# Patient Record
Sex: Female | Born: 1985 | Race: White | Hispanic: No | Smoking: Current every day smoker
Health system: Southern US, Community
[De-identification: ages and names within clinical notes are randomized; demographics above are authoritative.]

## PROBLEM LIST (undated history)

## (undated) DIAGNOSIS — F1910 Other psychoactive substance abuse, uncomplicated: Secondary | ICD-10-CM

## (undated) DIAGNOSIS — M419 Scoliosis, unspecified: Secondary | ICD-10-CM

## (undated) DIAGNOSIS — N809 Endometriosis, unspecified: Secondary | ICD-10-CM

## (undated) DIAGNOSIS — F419 Anxiety disorder, unspecified: Secondary | ICD-10-CM

## (undated) DIAGNOSIS — IMO0002 Reserved for concepts with insufficient information to code with codable children: Secondary | ICD-10-CM

## (undated) DIAGNOSIS — M25569 Pain in unspecified knee: Secondary | ICD-10-CM

---

## 2006-04-19 ENCOUNTER — Emergency Department (HOSPITAL_COMMUNITY): Admission: EM | Admit: 2006-04-19 | Discharge: 2006-04-19 | Payer: Self-pay | Admitting: *Deleted

## 2006-05-08 ENCOUNTER — Emergency Department (HOSPITAL_COMMUNITY): Admission: EM | Admit: 2006-05-08 | Discharge: 2006-05-08 | Payer: Self-pay | Admitting: Emergency Medicine

## 2008-08-22 ENCOUNTER — Ambulatory Visit (HOSPITAL_COMMUNITY): Admission: RE | Admit: 2008-08-22 | Discharge: 2008-08-22 | Payer: Self-pay | Admitting: Urology

## 2008-08-29 ENCOUNTER — Ambulatory Visit (HOSPITAL_COMMUNITY): Admission: RE | Admit: 2008-08-29 | Discharge: 2008-08-29 | Payer: Self-pay | Admitting: Urology

## 2008-11-23 ENCOUNTER — Emergency Department (HOSPITAL_COMMUNITY): Admission: EM | Admit: 2008-11-23 | Discharge: 2008-11-23 | Payer: Self-pay | Admitting: Emergency Medicine

## 2009-04-09 ENCOUNTER — Emergency Department (HOSPITAL_COMMUNITY): Admission: EM | Admit: 2009-04-09 | Discharge: 2009-04-09 | Payer: Self-pay | Admitting: Emergency Medicine

## 2010-10-10 LAB — URINALYSIS, ROUTINE W REFLEX MICROSCOPIC
Nitrite: NEGATIVE
Specific Gravity, Urine: 1.025 (ref 1.005–1.030)
Urobilinogen, UA: 0.2 mg/dL (ref 0.0–1.0)

## 2010-10-10 LAB — BASIC METABOLIC PANEL
BUN: 7 mg/dL (ref 6–23)
Chloride: 106 mEq/L (ref 96–112)
Glucose, Bld: 92 mg/dL (ref 70–99)
Potassium: 3.9 mEq/L (ref 3.5–5.1)

## 2010-10-10 LAB — URINE MICROSCOPIC-ADD ON

## 2010-10-10 LAB — DIFFERENTIAL
Eosinophils Absolute: 0.1 10*3/uL (ref 0.0–0.7)
Eosinophils Relative: 1 % (ref 0–5)
Lymphs Abs: 1.3 10*3/uL (ref 0.7–4.0)

## 2010-10-10 LAB — CBC
HCT: 35.1 % — ABNORMAL LOW (ref 36.0–46.0)
MCV: 95.3 fL (ref 78.0–100.0)
Platelets: 236 10*3/uL (ref 150–400)
RDW: 13 % (ref 11.5–15.5)
WBC: 7.7 10*3/uL (ref 4.0–10.5)

## 2010-10-10 LAB — HEPATIC FUNCTION PANEL
Alkaline Phosphatase: 43 U/L (ref 39–117)
Bilirubin, Direct: 0.2 mg/dL (ref 0.0–0.3)
Indirect Bilirubin: 1.1 mg/dL — ABNORMAL HIGH (ref 0.3–0.9)
Total Protein: 6.7 g/dL (ref 6.0–8.3)

## 2010-10-10 LAB — RAPID URINE DRUG SCREEN, HOSP PERFORMED: Cocaine: NOT DETECTED

## 2010-10-10 LAB — PREGNANCY, URINE: Preg Test, Ur: NEGATIVE

## 2010-10-10 LAB — TRICYCLICS SCREEN, URINE: TCA Scrn: NOT DETECTED

## 2010-11-19 NOTE — H&P (Signed)
Kayla Tanner, Kayla Tanner                  ACCOUNT NO.:  [id number]   MEDICAL RECORD NO.:  [id number]          PATIENT TYPE:  AMB   LOCATION:  DAY                           FACILITY:  [facility]   PHYSICIAN:  Dennie Maizes, M.D.   DATE OF BIRTH:  Aug 10, 1985   DATE OF ADMISSION:  DATE OF DISCHARGE:  LH                              HISTORY & PHYSICAL   CHIEF COMPLAINT:  Intermittent right flank pain, right distal ureteral  calculus with obstruction.   HISTORY OF PRESENT ILLNESS:  A 25 year old female was referred to me by  the ER at [facility].  She experienced severe right flank pain  radiating to the front associated with nausea and vomiting.  She went to  the emergency room on August 13 2008.  There is no past history of  urolithiasis or gross hematuria.  She has not had any fever, chills or  voiding difficulty.   A noncontrast CT of abdomen and pelvis was done in the ER.  This  includes two small stones in the upper pole of the left kidney.  There  is a 4-mm size stone in the right ureteropelvic junction with mild  hydronephrosis.  The patient has not passed the stone.  She has  persistent intermittent right flank pain.  X-ray KUB revealed the stone  has migrated to the right ureterovesical  junction area.   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   MEDICATIONS:  Oxycodone.   ALLERGIES:  PENICILLIN and LATEX.   FAMILY HISTORY:  Positive for kidney stones as well as cervical cast  carcinoma.   PERSONAL HISTORY:  The patient has two children,  ages 6 years and 7  weeks.   PHYSICAL EXAMINATION:  VITAL SIGNS: Height 5 feet 5 inches.  Weight 135  pounds.  HEENT:  Normal.  LUNGS:  Clear to auscultation.  HEART:  Regular rate and rhythm.  No murmurs.  ABDOMEN:  Soft.  No palpable flank masses.  Mild right costovertebral  angle tenderness was noted.  Bladder is not palpable.   IMPRESSION:  1. Right distal ureteral calculus with  obstruction.  2. Right hydronephrosis.  3. Right renal colic.   PLAN:  I have discussed with the patient regarding management of pain  options.  She is scheduled to undergo cystoscopy, retrograde pyelogram,  right ureteroscopy, stone extraction, and right ureteral stent placement  in short-stay center at [facility].  I have informed the  patient regarding diagnosis, operative details, alternative treatments,  outcome, possible risks and complications and she has agreed for the  procedure to be done.      Dennie Maizes, M.D.  Electronically Signed     SK/MEDQ  D:  09/03/2008  T:  09/03/2008  Job:  [id number]   cc:   Selinda Flavin  Fax: [phone]

## 2011-10-04 ENCOUNTER — Emergency Department (HOSPITAL_COMMUNITY)
Admission: EM | Admit: 2011-10-04 | Discharge: 2011-10-04 | Disposition: A | Discharge status: Home / Self Care | Payer: Self-pay | Attending: Emergency Medicine | Admitting: Emergency Medicine

## 2011-10-04 ENCOUNTER — Encounter (HOSPITAL_COMMUNITY): Payer: Self-pay | Admitting: *Deleted

## 2011-10-04 ENCOUNTER — Emergency Department (HOSPITAL_COMMUNITY): Payer: Self-pay

## 2011-10-04 DIAGNOSIS — X500XXA Overexertion from strenuous movement or load, initial encounter: Secondary | ICD-10-CM | POA: Insufficient documentation

## 2011-10-04 DIAGNOSIS — M545 Low back pain, unspecified: Secondary | ICD-10-CM | POA: Insufficient documentation

## 2011-10-04 DIAGNOSIS — M5380 Other specified dorsopathies, site unspecified: Secondary | ICD-10-CM | POA: Insufficient documentation

## 2011-10-04 DIAGNOSIS — R109 Unspecified abdominal pain: Secondary | ICD-10-CM | POA: Insufficient documentation

## 2011-10-04 DIAGNOSIS — S39012A Strain of muscle, fascia and tendon of lower back, initial encounter: Secondary | ICD-10-CM

## 2011-10-04 DIAGNOSIS — F17200 Nicotine dependence, unspecified, uncomplicated: Secondary | ICD-10-CM | POA: Insufficient documentation

## 2011-10-04 DIAGNOSIS — S335XXA Sprain of ligaments of lumbar spine, initial encounter: Secondary | ICD-10-CM | POA: Insufficient documentation

## 2011-10-04 HISTORY — DX: Scoliosis, unspecified: M41.9

## 2011-10-04 LAB — POCT PREGNANCY, URINE: Preg Test, Ur: NEGATIVE

## 2011-10-04 MED ORDER — IBUPROFEN 800 MG PO TABS: 800.0000 mg | ORAL_TABLET | Freq: Three times a day (TID) | ORAL | Status: AC

## 2011-10-04 MED ORDER — ONDANSETRON 8 MG PO TBDP
8.0000 mg | ORAL_TABLET | Freq: Once | ORAL | Status: AC
  Administered 2011-10-04: 8 mg via ORAL
  Filled 2011-10-04: qty 1

## 2011-10-04 MED ORDER — OXYCODONE-ACETAMINOPHEN 5-325 MG PO TABS: 1.0000 | ORAL_TABLET | ORAL | Status: AC | PRN

## 2011-10-04 MED ORDER — HYDROMORPHONE HCL PF 1 MG/ML IJ SOLN
1.0000 mg | Freq: Once | INTRAMUSCULAR | Status: AC
  Administered 2011-10-04: 1 mg via INTRAMUSCULAR
  Filled 2011-10-04: qty 1

## 2011-10-04 MED ORDER — METHOCARBAMOL 500 MG PO TABS: 1000.0000 mg | ORAL_TABLET | Freq: Two times a day (BID) | ORAL | Status: AC

## 2011-10-04 NOTE — ED Notes (Signed)
Pt in process of moving and picked up a TV and since c/o lower back pain that radiates some

## 2011-10-04 NOTE — Discharge Instructions (Signed)
Back Pain, Adult Low back pain is very common. About 1 in 5 people have back pain.The cause of low back pain is rarely dangerous. The pain often gets better over time.About half of people with a sudden onset of back pain feel better in just 2 weeks. About 8 in 10 people feel better by 6 weeks.  CAUSES Some common causes of back pain include:  Strain of the muscles or ligaments supporting the spine.   Wear and tear (degeneration) of the spinal discs.   Arthritis.   Direct injury to the back.  DIAGNOSIS Most of the time, the direct cause of low back pain is not known.However, back pain can be treated effectively even when the exact cause of the pain is unknown.Answering your caregiver's questions about your overall health and symptoms is one of the most accurate ways to make sure the cause of your pain is not dangerous. If your caregiver needs more information, he or she may order lab work or imaging tests (X-rays or MRIs).However, even if imaging tests show changes in your back, this usually does not require surgery. HOME CARE INSTRUCTIONS For many people, back pain returns.Since low back pain is rarely dangerous, it is often a condition that people can learn to manageon their own.   Remain active. It is stressful on the back to sit or stand in one place. Do not sit, drive, or stand in one place for more than 30 minutes at a time. Take short walks on level surfaces as soon as pain allows.Try to increase the length of time you walk each day.   Do not stay in bed.Resting more than 1 or 2 days can delay your recovery.   Do not avoid exercise or work.Your body is made to move.It is not dangerous to be active, even though your back may hurt.Your back will likely heal faster if you return to being active before your pain is gone.   Pay attention to your body when you bend and lift. Many people have less discomfortwhen lifting if they bend their knees, keep the load close to their  bodies,and avoid twisting. Often, the most comfortable positions are those that put less stress on your recovering back.   Find a comfortable position to sleep. Use a firm mattress and lie on your side with your knees slightly bent. If you lie on your back, put a pillow under your knees.   Only take over-the-counter or prescription medicines as directed by your caregiver. Over-the-counter medicines to reduce pain and inflammation are often the most helpful.Your caregiver may prescribe muscle relaxant drugs.These medicines help dull your pain so you can more quickly return to your normal activities and healthy exercise.   Put ice on the injured area.   Put ice in a plastic bag.   Place a towel between your skin and the bag.   Leave the ice on for 15 to 20 minutes, 3 to 4 times a day for the first 2 to 3 days. After that, ice and heat may be alternated to reduce pain and spasms.   Ask your caregiver about trying back exercises and gentle massage. This may be of some benefit.   Avoid feeling anxious or stressed.Stress increases muscle tension and can worsen back pain.It is important to recognize when you are anxious or stressed and learn ways to manage it.Exercise is a great option.  SEEK MEDICAL CARE IF:  You have pain that is not relieved with rest or medicine.   You have   pain that does not improve in 1 week.   You have new symptoms.   You are generally not feeling well.  SEEK IMMEDIATE MEDICAL CARE IF:   You have pain that radiates from your back into your legs.   You develop new bowel or bladder control problems.   You have unusual weakness or numbness in your arms or legs.   You develop nausea or vomiting.   You develop abdominal pain.   You feel faint.  Document Released: 06/23/2005 Document Revised: 06/12/2011 Document Reviewed: 11/11/2010 [facility] Patient Information 2012 [place], [place].     Do not drive within 4 hours of taking oxycodone as this will make you  drowsy.  Avoid lifting,  Bending,  Twisting or any other activity that worsens your pain over the next week.   You should get rechecked if your symptoms are not better over the next 5 days,  Or you develop increased pain,  Weakness in your leg(s) or loss of bladder or bowel function - these are symptoms of a worse injury.

## 2011-10-04 NOTE — ED Provider Notes (Signed)
History     CSN: [id number]  Arrival date & time 10/04/11  1449   First MD Initiated Contact with Patient 10/04/11 1520      Chief Complaint  Patient presents with  . Back Pain    (Consider location/radiation/quality/duration/timing/severity/associated sxs/prior treatment) Patient is a 26 y.o. female presenting with back pain. The history is provided by the patient.  Back Pain  This is a new problem. The current episode started 3 to 5 hours ago. The problem occurs constantly. The problem has not changed since onset.The pain is associated with lifting heavy objects (She lifted a tv while moving today.). The pain is present in the lumbar spine. The quality of the pain is described as stabbing. Radiates to: Radiates to right lower flank. The pain is at a severity of 8/10. The pain is moderate. The symptoms are aggravated by bending, twisting and certain positions. The pain is the same all the time. Pertinent negatives include no chest pain, no fever, no numbness, no headaches, no abdominal pain, no abdominal swelling, no bowel incontinence, no perianal numbness, no bladder incontinence, no dysuria, no pelvic pain, no leg pain, no paresthesias, no paresis, no tingling and no weakness. She has tried nothing for the symptoms. Risk factors: scoliosis.    Past Medical History  Diagnosis Date  . Scoliosis     Past Surgical History  Procedure Date  . Cesarean section     History reviewed. No pertinent family history.  History  Substance Use Topics  . Smoking status: Current Everyday Smoker -- 0.5 packs/day    Types: Cigarettes  . Smokeless tobacco: Not on file  . Alcohol Use: Yes     very rare use    OB History    Grav Para Term Preterm Abortions TAB SAB Ect Mult Living                  Review of Systems  Constitutional: Negative for fever.  HENT: Negative for congestion, sore throat and neck pain.   Eyes: Negative.   Respiratory: Negative for chest tightness and shortness of  breath.   Cardiovascular: Negative for chest pain.  Gastrointestinal: Negative for nausea, abdominal pain and bowel incontinence.  Genitourinary: Negative.  Negative for bladder incontinence, dysuria and pelvic pain.  Musculoskeletal: Positive for back pain. Negative for joint swelling and arthralgias.  Skin: Negative.  Negative for rash and wound.  Neurological: Negative for dizziness, tingling, weakness, light-headedness, numbness, headaches and paresthesias.  Hematological: Negative.   Psychiatric/Behavioral: Negative.     Allergies  Penicillins  Home Medications  No current outpatient prescriptions on file.  BP 116/81  Pulse 107  Temp(Src) 97.8 F (36.6 C) (Oral)  Resp 18  Ht 5\' 5"  (1.651 m)  Wt 123 lb (55.792 kg)  BMI 20.47 kg/m2  SpO2 100%  LMP 08/21/2011  Physical Exam  Nursing note and vitals reviewed. Constitutional: She is oriented to person, place, and time. She appears well-developed and well-nourished.  HENT:  Head: Normocephalic.  Eyes: Conjunctivae are normal.  Neck: Normal range of motion. Neck supple.  Cardiovascular: Regular rhythm and intact distal pulses.        Pedal pulses normal.  Pulmonary/Chest: Effort normal. She has no wheezes.  Abdominal: Soft. Bowel sounds are normal. She exhibits no distension and no mass.  Musculoskeletal: Normal range of motion. She exhibits no edema.       Lumbar back: She exhibits tenderness and spasm. She exhibits no swelling and no edema.  Back:  Neurological: She is alert and oriented to person, place, and time. She has normal strength. She displays no atrophy and no tremor. No cranial nerve deficit or sensory deficit. Gait normal.  Reflex Scores:      Patellar reflexes are 2+ on the right side and 2+ on the left side.      Achilles reflexes are 2+ on the right side and 2+ on the left side.      No strength deficit noted in hip and knee flexor and extensor muscle groups.  Ankle flexion and extension intact.    Skin: Skin is warm and dry.  Psychiatric: She has a normal mood and affect.    ED Course  Procedures (including critical care time)   Labs Reviewed  POCT PREGNANCY, URINE   Dg Lumbar Spine Complete  10/04/2011  *RADIOLOGY REPORT*  Clinical Data: History of low back pain after taking out a heavy object.  LUMBAR SPINE - COMPLETE 4+ VIEW  Comparison: No priors.  Findings: Multiple views of the lumbar spine demonstrate no acute displaced fractures or compression type fractures.  Alignment is anatomic.  Intervertebral disc heights appear preserved at all levels.  IMPRESSION: 1.  No acute radiographic abnormality of the lumbar spine.  Original Report Authenticated By: Florencia Reasons, M.D.     No diagnosis found.  Dilaudid 1 mg IM given with moderate improvement in symptoms.  MDM  Patient prescribed Percocet, ibuprofen 800 mg, Robaxin.  Five-day course in edition patient advised to minimize lifting bending twisting or any activity that worsens her pain.  Heat therapy 20 minutes 4 times a day.  Recheck after 5 days if her symptoms are not improved or if they return after she completes this course.  Also advised to watch for any signs of weakness in her legs, numbness, urinary or bowel changes.        Candis Musa, PA 10/04/11 1642

## 2011-10-04 NOTE — ED Provider Notes (Signed)
Medical screening examination/treatment/procedure(s) were performed by non-physician practitioner and as supervising physician I was immediately available for consultation/collaboration.  Latese Dufault, MD 10/04/11 1908 

## 2011-11-29 ENCOUNTER — Emergency Department (HOSPITAL_COMMUNITY)
Admission: EM | Admit: 2011-11-29 | Discharge: 2011-11-29 | Disposition: A | Discharge status: Home / Self Care | Payer: Self-pay | Attending: Emergency Medicine | Admitting: Emergency Medicine

## 2011-11-29 ENCOUNTER — Encounter (HOSPITAL_COMMUNITY): Payer: Self-pay

## 2011-11-29 ENCOUNTER — Emergency Department (HOSPITAL_COMMUNITY): Payer: Self-pay

## 2011-11-29 DIAGNOSIS — IMO0002 Reserved for concepts with insufficient information to code with codable children: Secondary | ICD-10-CM | POA: Insufficient documentation

## 2011-11-29 DIAGNOSIS — M549 Dorsalgia, unspecified: Secondary | ICD-10-CM | POA: Insufficient documentation

## 2011-11-29 DIAGNOSIS — S239XXA Sprain of unspecified parts of thorax, initial encounter: Secondary | ICD-10-CM | POA: Insufficient documentation

## 2011-11-29 MED ORDER — IBUPROFEN 600 MG PO TABS: 600.0000 mg | ORAL_TABLET | Freq: Four times a day (QID) | ORAL | Status: AC | PRN

## 2011-11-29 MED ORDER — HYDROMORPHONE HCL PF 1 MG/ML IJ SOLN
1.0000 mg | Freq: Once | INTRAMUSCULAR | Status: AC
  Administered 2011-11-29: 1 mg via INTRAMUSCULAR
  Filled 2011-11-29: qty 1

## 2011-11-29 MED ORDER — ONDANSETRON 8 MG PO TBDP
8.0000 mg | ORAL_TABLET | Freq: Once | ORAL | Status: AC
  Administered 2011-11-29: 8 mg via ORAL
  Filled 2011-11-29: qty 1

## 2011-11-29 MED ORDER — HYDROCODONE-ACETAMINOPHEN 5-325 MG PO TABS: 1.0000 | ORAL_TABLET | ORAL | Status: AC | PRN
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
## 2011-11-29 NOTE — Discharge Instructions (Signed)
Back Pain, Adult Low back pain is very common. About 1 in 5 people have back pain.The cause of low back pain is rarely dangerous. The pain often gets better over time.About half of people with a sudden onset of back pain feel better in just 2 weeks. About 8 in 10 people feel better by 6 weeks.  CAUSES Some common causes of back pain include:  Strain of the muscles or ligaments supporting the spine.   Wear and tear (degeneration) of the spinal discs.   Arthritis.   Direct injury to the back.  DIAGNOSIS Most of the time, the direct cause of low back pain is not known.However, back pain can be treated effectively even when the exact cause of the pain is unknown.Answering your caregiver's questions about your overall health and symptoms is one of the most accurate ways to make sure the cause of your pain is not dangerous. If your caregiver needs more information, he or she may order lab work or imaging tests (X-rays or MRIs).However, even if imaging tests show changes in your back, this usually does not require surgery. HOME CARE INSTRUCTIONS For many people, back pain returns.Since low back pain is rarely dangerous, it is often a condition that people can learn to manageon their own.   Remain active. It is stressful on the back to sit or stand in one place. Do not sit, drive, or stand in one place for more than 30 minutes at a time. Take short walks on level surfaces as soon as pain allows.Try to increase the length of time you walk each day.   Do not stay in bed.Resting more than 1 or 2 days can delay your recovery.   Do not avoid exercise or work.Your body is made to move.It is not dangerous to be active, even though your back may hurt.Your back will likely heal faster if you return to being active before your pain is gone.   Pay attention to your body when you bend and lift. Many people have less discomfortwhen lifting if they bend their knees, keep the load close to their  bodies,and avoid twisting. Often, the most comfortable positions are those that put less stress on your recovering back.   Find a comfortable position to sleep. Use a firm mattress and lie on your side with your knees slightly bent. If you lie on your back, put a pillow under your knees.   Only take over-the-counter or prescription medicines as directed by your caregiver. Over-the-counter medicines to reduce pain and inflammation are often the most helpful.Your caregiver may prescribe muscle relaxant drugs.These medicines help dull your pain so you can more quickly return to your normal activities and healthy exercise.   Put ice on the injured area.   Put ice in a plastic bag.   Place a towel between your skin and the bag.   Leave the ice on for 15 to 20 minutes, 3 to 4 times a day for the first 2 to 3 days. After that, ice and heat may be alternated to reduce pain and spasms.   Ask your caregiver about trying back exercises and gentle massage. This may be of some benefit.   Avoid feeling anxious or stressed.Stress increases muscle tension and can worsen back pain.It is important to recognize when you are anxious or stressed and learn ways to manage it.Exercise is a great option.  SEEK MEDICAL CARE IF:  You have pain that is not relieved with rest or medicine.   You have   pain that does not improve in 1 week.   You have new symptoms.   You are generally not feeling well.  SEEK IMMEDIATE MEDICAL CARE IF:   You have pain that radiates from your back into your legs.   You develop new bowel or bladder control problems.   You have unusual weakness or numbness in your arms or legs.   You develop nausea or vomiting.   You develop abdominal pain.   You feel faint.  Document Released: 06/23/2005 Document Revised: 06/12/2011 Document Reviewed: 11/11/2010 [facility] Patient Information 2012 [place], [place].   Your x-rays are negative tonight for any bony injury to your back.    Use  the the other medicines as directed.  Do not drive within 4 hours of taking oxycodone as this will make you drowsy.  Avoid lifting,  Bending,  Twisting or any other activity that worsens your pain over the next week.  Apply an  icepack  to your lower back for 10-15 minutes every 2 hours for the next 2 days.  You should get rechecked if your symptoms are not better over the next 5 days,  Or you develop increased pain,  Weakness in your leg(s) or loss of bladder or bowel function - these are symptoms of a worse injury.

## 2011-11-29 NOTE — ED Notes (Signed)
J. Idol, PA at bedside. 
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
## 2011-12-04 NOTE — ED Provider Notes (Signed)
Medical screening examination/treatment/procedure(s) were performed by non-physician practitioner and as supervising physician I was immediately available for consultation/collaboration.   Kaitlan Bin M Lonzell Dorris, MD 12/04/11 0054 

## 2012-02-19 ENCOUNTER — Emergency Department (HOSPITAL_COMMUNITY): Payer: Self-pay

## 2012-02-19 ENCOUNTER — Encounter (HOSPITAL_COMMUNITY): Payer: Self-pay | Admitting: *Deleted

## 2012-02-19 ENCOUNTER — Emergency Department (HOSPITAL_COMMUNITY)
Admission: EM | Admit: 2012-02-19 | Discharge: 2012-02-19 | Disposition: A | Discharge status: Home / Self Care | Payer: Self-pay | Attending: Emergency Medicine | Admitting: Emergency Medicine

## 2012-02-19 DIAGNOSIS — S0083XA Contusion of other part of head, initial encounter: Secondary | ICD-10-CM

## 2012-02-19 DIAGNOSIS — F17200 Nicotine dependence, unspecified, uncomplicated: Secondary | ICD-10-CM | POA: Insufficient documentation

## 2012-02-19 DIAGNOSIS — IMO0002 Reserved for concepts with insufficient information to code with codable children: Secondary | ICD-10-CM | POA: Insufficient documentation

## 2012-02-19 DIAGNOSIS — S63609A Unspecified sprain of unspecified thumb, initial encounter: Secondary | ICD-10-CM

## 2012-02-19 DIAGNOSIS — S0003XA Contusion of scalp, initial encounter: Secondary | ICD-10-CM | POA: Insufficient documentation

## 2012-02-19 DIAGNOSIS — T07XXXA Unspecified multiple injuries, initial encounter: Secondary | ICD-10-CM

## 2012-02-19 DIAGNOSIS — S43402A Unspecified sprain of left shoulder joint, initial encounter: Secondary | ICD-10-CM

## 2012-02-19 DIAGNOSIS — S6390XA Sprain of unspecified part of unspecified wrist and hand, initial encounter: Secondary | ICD-10-CM | POA: Insufficient documentation

## 2012-02-19 LAB — URINALYSIS, ROUTINE W REFLEX MICROSCOPIC
Bilirubin Urine: NEGATIVE
Hgb urine dipstick: NEGATIVE
Nitrite: NEGATIVE
Specific Gravity, Urine: 1.025 (ref 1.005–1.030)
pH: 7 (ref 5.0–8.0)

## 2012-02-19 LAB — POCT PREGNANCY, URINE: Preg Test, Ur: NEGATIVE

## 2012-02-19 MED ORDER — HYDROMORPHONE HCL PF 1 MG/ML IJ SOLN
1.0000 mg | Freq: Once | INTRAMUSCULAR | Status: AC
  Administered 2012-02-19: 1 mg via INTRAMUSCULAR
  Filled 2012-02-19: qty 1

## 2012-02-19 MED ORDER — HYDROCODONE-ACETAMINOPHEN 7.5-325 MG PO TABS: 1.0000 | ORAL_TABLET | Freq: Four times a day (QID) | ORAL | Status: AC | PRN

## 2012-02-19 MED ORDER — MELOXICAM 7.5 MG PO TABS: ORAL_TABLET | ORAL | Status: DC

## 2012-02-19 MED ORDER — PROMETHAZINE HCL 12.5 MG PO TABS
12.5000 mg | ORAL_TABLET | Freq: Once | ORAL | Status: AC
  Administered 2012-02-19: 12.5 mg via ORAL
  Filled 2012-02-19: qty 1
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
## 2012-02-19 NOTE — ED Provider Notes (Signed)
Medical screening examination/treatment/procedure(s) were performed by non-physician practitioner and as supervising physician I was immediately available for consultation/collaboration.   Tinzley Dalia L Natasha Paulson, MD 02/19/12 1422 

## 2013-09-22 ENCOUNTER — Ambulatory Visit (HOSPITAL_COMMUNITY): Admission: RE | Admit: 2013-09-22 | Payer: Medicaid Other | Source: Home / Self Care | Admitting: Psychiatry

## 2013-09-22 ENCOUNTER — Emergency Department (HOSPITAL_COMMUNITY)
Admission: EM | Admit: 2013-09-22 | Discharge: 2013-09-22 | Disposition: A | Discharge status: Home / Self Care | Payer: Medicaid Other | Attending: Emergency Medicine | Admitting: Emergency Medicine

## 2013-09-22 ENCOUNTER — Encounter (HOSPITAL_COMMUNITY): Payer: Self-pay | Admitting: Emergency Medicine

## 2013-09-22 DIAGNOSIS — F1110 Opioid abuse, uncomplicated: Secondary | ICD-10-CM

## 2013-09-22 DIAGNOSIS — F1120 Opioid dependence, uncomplicated: Secondary | ICD-10-CM | POA: Insufficient documentation

## 2013-09-22 DIAGNOSIS — R002 Palpitations: Secondary | ICD-10-CM | POA: Insufficient documentation

## 2013-09-22 DIAGNOSIS — M4120 Other idiopathic scoliosis, site unspecified: Secondary | ICD-10-CM | POA: Insufficient documentation

## 2013-09-22 DIAGNOSIS — F39 Unspecified mood [affective] disorder: Secondary | ICD-10-CM | POA: Insufficient documentation

## 2013-09-22 DIAGNOSIS — Z791 Long term (current) use of non-steroidal anti-inflammatories (NSAID): Secondary | ICD-10-CM | POA: Insufficient documentation

## 2013-09-22 DIAGNOSIS — F411 Generalized anxiety disorder: Secondary | ICD-10-CM | POA: Insufficient documentation

## 2013-09-22 DIAGNOSIS — Z880 Allergy status to penicillin: Secondary | ICD-10-CM | POA: Insufficient documentation

## 2013-09-22 DIAGNOSIS — F17200 Nicotine dependence, unspecified, uncomplicated: Secondary | ICD-10-CM | POA: Insufficient documentation

## 2013-09-22 DIAGNOSIS — H53149 Visual discomfort, unspecified: Secondary | ICD-10-CM | POA: Insufficient documentation

## 2013-09-22 HISTORY — DX: Anxiety disorder, unspecified: F41.9

## 2013-09-22 HISTORY — DX: Endometriosis, unspecified: N80.9

## 2013-09-22 HISTORY — DX: Other psychoactive substance abuse, uncomplicated: F19.10

## 2013-09-22 HISTORY — DX: Pain in unspecified knee: M25.569

## 2013-09-22 HISTORY — DX: Reserved for concepts with insufficient information to code with codable children: IMO0002

## 2013-09-22 NOTE — ED Provider Notes (Signed)
CSN: [id number]     Arrival date & time 09/22/13  1225 History  This chart was scribed for non-physician practitioner working with Celene Kras, MD by Ashley Jacobs, ED scribe. This patient was seen in room WTR7/WTR7 and the patient's care was started at 1:51 PM.   First MD Initiated Contact with Patient 09/22/13 1333     Chief Complaint  Patient presents with  . Medical Clearance    opiate addiction     (Consider location/radiation/quality/duration/timing/severity/associated sxs/prior Treatment) The history is provided by the patient and medical records. No language interpreter was used.   HPI Comments: Kayla Tanner is a 28 y.o. female who presents to the Emergency Department for Medical Clearance. Pt is requesting detox from opiates. She has tried detoxing from opiates four times in the past w/o the assistance of professional help.She states, "I have been taking anything I can get my hands on.". She feels like she is having a panic attack. Pt is experiencing photophobia and tachycardia .  Pt was seen at [facility] today and was told to go the ED. Denies HI and SI.  Denies the use of alcohol. Pt currently smokes 0.5 pk of cigarettes a day and smoke marijuana last week.  Past Medical History  Diagnosis Date  . Scoliosis    Past Surgical History  Procedure Laterality Date  . Cesarean section     No family history on file. History  Substance Use Topics  . Smoking status: Current Every Day Smoker -- 0.50 packs/day    Types: Cigarettes  . Smokeless tobacco: Not on file  . Alcohol Use: Yes     Comment: very rare use   OB History   Grav Para Term Preterm Abortions TAB SAB Ect Mult Living                 Review of Systems  Eyes: Positive for photophobia.  Cardiovascular: Positive for palpitations.  Psychiatric/Behavioral: Positive for dysphoric mood. Negative for suicidal ideas. The patient is nervous/anxious.   All other systems reviewed and are  negative.      Allergies  Penicillins  Home Medications   Current Outpatient Rx  Name  Route  Sig  Dispense  Refill  . meloxicam (MOBIC) 7.5 MG tablet      1 po bid with food   12 tablet   0    BP 112/54  Pulse 80  Temp(Src) 98 F (36.7 C) (Oral)  Resp 18  Wt 135 lb (61.236 kg)  SpO2 100% Physical Exam  Nursing note and vitals reviewed. Constitutional: She is oriented to person, place, and time. She appears well-developed and well-nourished. No distress.  Pt appears anxious.  HENT:  Head: Normocephalic and atraumatic.  Eyes: EOM are normal. Pupils are equal, round, and reactive to light.  Neck: Normal range of motion. Neck supple. No tracheal deviation present.  Cardiovascular: Normal rate and regular rhythm.   Pulmonary/Chest: Effort normal. No respiratory distress. She has no wheezes. She has no rales.  Abdominal: Soft. She exhibits no distension.  Musculoskeletal: Normal range of motion.  Neurological: She is alert and oriented to person, place, and time.  Skin: Skin is warm and dry.  Psychiatric: She has a normal mood and affect. Her behavior is normal.    ED Course  Procedures (including critical care time) DIAGNOSTIC STUDIES: Oxygen Saturation is 100% on room air, normal by my interpretation.    COORDINATION OF CARE:  1:55 PM  Discussed course of care with pt .  Pt understands and agrees.    Labs Review Labs Reviewed - No data to display Imaging Review No results found.   EKG Interpretation None      MDM   Final diagnoses:  Opiate abuse, continuous    2:23 PM  Patient will be discharged with outpatient resources for detox. Vitals stable and patient afebrile. ACT team spoke with the patient.   I personally performed the services described in this documentation, which was scribed in my presence. The recorded information has been reviewed and is accurate.     Emilia BeckKaitlyn Lc Joynt, PA-C 09/22/13 1424

## 2013-09-22 NOTE — ED Notes (Signed)
Pt is requesting help with detox from opiates. Denies alcohol use. Pt alert and cooperative. Pt stated that she "wants to get straight for her kids". Previous history of attempted detox x4. Did not seek professional help for previous attempts.  Friend at bedside

## 2013-09-22 NOTE — ED Provider Notes (Signed)
Medical screening examination/treatment/procedure(s) were performed by non-physician practitioner and as supervising physician I was immediately available for consultation/collaboration.   Celene KrasJon R Bereket Gernert, MD 09/22/13 [phone]

## 2013-09-22 NOTE — Discharge Instructions (Signed)
Use the resources provided. Refer to attached documents for more information.
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
## 2021-12-02 ENCOUNTER — Emergency Department (HOSPITAL_COMMUNITY)
Admission: EM | Admit: 2021-12-02 | Discharge: 2021-12-02 | Disposition: A | Discharge status: Home / Self Care | Payer: Medicaid Other | Attending: Emergency Medicine | Admitting: Emergency Medicine

## 2021-12-02 ENCOUNTER — Encounter (HOSPITAL_COMMUNITY): Payer: Self-pay

## 2021-12-02 ENCOUNTER — Emergency Department (HOSPITAL_COMMUNITY): Payer: Medicaid Other

## 2021-12-02 DIAGNOSIS — R600 Localized edema: Secondary | ICD-10-CM | POA: Diagnosis not present

## 2021-12-02 DIAGNOSIS — M7989 Other specified soft tissue disorders: Secondary | ICD-10-CM | POA: Diagnosis present

## 2021-12-02 DIAGNOSIS — L309 Dermatitis, unspecified: Secondary | ICD-10-CM | POA: Insufficient documentation

## 2021-12-02 LAB — COMPREHENSIVE METABOLIC PANEL
ALT: 16 U/L (ref 0–44)
AST: 18 U/L (ref 15–41)
Albumin: 4 g/dL (ref 3.5–5.0)
Alkaline Phosphatase: 56 U/L (ref 38–126)
Anion gap: 5 (ref 5–15)
BUN: 15 mg/dL (ref 6–20)
CO2: 30 mmol/L (ref 22–32)
Calcium: 8.7 mg/dL — ABNORMAL LOW (ref 8.9–10.3)
Chloride: 104 mmol/L (ref 98–111)
Creatinine, Ser: 0.94 mg/dL (ref 0.44–1.00)
GFR, Estimated: >60 mL/min (ref >60)
Glucose, Bld: 98 mg/dL (ref 70–99)
Potassium: 3.9 mmol/L (ref 3.5–5.1)
Sodium: 139 mmol/L (ref 135–145)
Total Bilirubin: <0.1 mg/dL — ABNORMAL LOW (ref 0.3–1.2)
Total Protein: 7.4 g/dL (ref 6.5–8.1)

## 2021-12-02 LAB — URINALYSIS, ROUTINE W REFLEX MICROSCOPIC
Bilirubin Urine: NEGATIVE
Glucose, UA: NEGATIVE mg/dL
Hgb urine dipstick: NEGATIVE
Ketones, ur: NEGATIVE mg/dL
Leukocytes,Ua: NEGATIVE
Nitrite: NEGATIVE
Protein, ur: NEGATIVE mg/dL
Specific Gravity, Urine: 1.019 (ref 1.005–1.030)
pH: 7 (ref 5.0–8.0)

## 2021-12-02 LAB — CBC WITH DIFFERENTIAL/PLATELET
Abs Immature Granulocytes: 0.02 10*3/uL (ref 0.00–0.07)
Basophils Absolute: 0.1 10*3/uL (ref 0.0–0.1)
Basophils Relative: 1 %
Eosinophils Absolute: 0.2 10*3/uL (ref 0.0–0.5)
Eosinophils Relative: 4 %
HCT: 37.8 % (ref 36.0–46.0)
Hemoglobin: 12.4 g/dL (ref 12.0–15.0)
Immature Granulocytes: 0 %
Lymphocytes Relative: 33 %
Lymphs Abs: 2 10*3/uL (ref 0.7–4.0)
MCH: 30.8 pg (ref 26.0–34.0)
MCHC: 32.8 g/dL (ref 30.0–36.0)
MCV: 94 fL (ref 80.0–100.0)
Monocytes Absolute: 0.5 10*3/uL (ref 0.1–1.0)
Monocytes Relative: 9 %
Neutro Abs: 3.3 10*3/uL (ref 1.7–7.7)
Neutrophils Relative %: 53 %
Platelets: 376 10*3/uL (ref 150–400)
RBC: 4.02 MIL/uL (ref 3.87–5.11)
RDW: 12.7 % (ref 11.5–15.5)
WBC: 6.1 10*3/uL (ref 4.0–10.5)
nRBC: 0 % (ref 0.0–0.2)

## 2021-12-02 LAB — BRAIN NATRIURETIC PEPTIDE: B Natriuretic Peptide: 50 pg/mL (ref 0.0–100.0)

## 2021-12-02 LAB — POC URINE PREG, ED: Preg Test, Ur: NEGATIVE

## 2021-12-02 MED ORDER — FUROSEMIDE 20 MG PO TABS: 20.0000 mg | ORAL_TABLET | Freq: Every day | ORAL | 0 refills | Status: AC

## 2021-12-02 NOTE — ED Provider Notes (Signed)
[facility] EMERGENCY DEPARTMENT Provider Note   CSN: [id number] Arrival date & time: 12/02/21  6812     History  Chief Complaint  Patient presents with   Leg Swelling    Bilateral foot swelling    Kayla Tanner is a 36 y.o. female.  Patient presents to the emergency department for evaluation of bilateral foot swelling.  Patient reports that she was recently diagnosed with cellulitis.  Patient reports that the swelling and redness has been going back and forth between the feet.      Home Medications Prior to Admission medications   Medication Sig Start Date End Date Taking? Authorizing Provider  clindamycin (CLEOCIN) 300 MG capsule Take 300 mg by mouth 3 (three) times daily. 11/25/21  Yes [provider]  furosemide (LASIX) 20 MG tablet Take 1 tablet (20 mg total) by mouth daily. 12/02/21  Yes Daphnee Preiss, Canary Brim, MD      Allergies    Amoxicillin and Penicillins    Review of Systems   Review of Systems  Physical Exam Updated Vital Signs BP 129/79   Pulse (!) 108   Temp 97.8 F (36.6 C) (Oral)   Resp 17   Ht 5\' 5"  (1.651 m)   Wt 64.9 kg   SpO2 99%   BMI 23.80 kg/m  Physical Exam Vitals and nursing note reviewed.  Constitutional:      General: She is not in acute distress.    Appearance: She is well-developed.  HENT:     Head: Normocephalic and atraumatic.     Mouth/Throat:     Mouth: Mucous membranes are moist.  Eyes:     General: Vision grossly intact. Gaze aligned appropriately.     Extraocular Movements: Extraocular movements intact.     Conjunctiva/sclera: Conjunctivae normal.  Cardiovascular:     Rate and Rhythm: Normal rate and regular rhythm.     Pulses: Normal pulses.     Heart sounds: Normal heart sounds, S1 normal and S2 normal. No murmur heard.   No friction rub. No gallop.  Pulmonary:     Effort: Pulmonary effort is normal. No respiratory distress.     Breath sounds: Normal breath sounds.  Abdominal:     General: Bowel sounds  are normal.     Palpations: Abdomen is soft.     Tenderness: There is no abdominal tenderness. There is no guarding or rebound.     Hernia: No hernia is present.  Musculoskeletal:        General: No swelling.     Cervical back: Full passive range of motion without pain, normal range of motion and neck supple. No spinous process tenderness or muscular tenderness. Normal range of motion.     Right lower leg: Edema present.     Left lower leg: Edema present.  Skin:    General: Skin is warm and dry.     Capillary Refill: Capillary refill takes less than 2 seconds.     Findings: Erythema (Bilateral feet) present. No ecchymosis, rash or wound.  Neurological:     General: No focal deficit present.     Mental Status: She is alert and oriented to person, place, and time.     GCS: GCS eye subscore is 4. GCS verbal subscore is 5. GCS motor subscore is 6.     Cranial Nerves: Cranial nerves 2-12 are intact.     Sensory: Sensation is intact.     Motor: Motor function is intact.     Coordination: Coordination is  intact.  Psychiatric:        Attention and Perception: Attention normal.        Mood and Affect: Mood normal.        Speech: Speech normal.        Behavior: Behavior normal.    ED Results / Procedures / Treatments   Labs (all labs ordered are listed, but only abnormal results are displayed) Labs Reviewed  COMPREHENSIVE METABOLIC PANEL - Abnormal; Notable for the following components:      Result Value   Calcium 8.7 (*)    Total Bilirubin <0.1 (*)    All other components within normal limits  URINALYSIS, ROUTINE W REFLEX MICROSCOPIC - Abnormal; Notable for the following components:   APPearance CLOUDY (*)    All other components within normal limits  CBC WITH DIFFERENTIAL/PLATELET  BRAIN NATRIURETIC PEPTIDE  POC URINE PREG, ED    EKG None  Radiology DG Chest 2 View  Result Date: 12/02/2021 CLINICAL DATA:  Edema.  Bilateral foot swelling EXAM: CHEST - 2 VIEW COMPARISON:   Chest CT 02/19/2012 FINDINGS: Normal heart size and mediastinal contours. No acute infiltrate or edema. No effusion or pneumothorax. No acute osseous findings. IMPRESSION: Negative chest. Electronically Signed   By: Tiburcio Pea M.D.   On: 12/02/2021 04:32    Procedures Procedures    Medications Ordered in ED Medications - No data to display  ED Course/ Medical Decision Making/ A&P                           Medical Decision Making Amount and/or Complexity of Data Reviewed Labs: ordered. Radiology: ordered.   Patient presents to the emergency department with complaints of redness and swelling of both of her feet.  Patient reports that she is on clindamycin as she was previously diagnosed with cellulitis at an outside hospital.  She reports that this is happened many times in the past, has not had an answer for why.  Differential diagnosis includes congestive heart failure, nephrotic syndrome, kidney failure, cellulitis.  Patient examination reveals nonpitting edema of both feet.  There is some mild erythema without any significant warmth.  Symptoms are very symmetric.  Doubt that this is a cellulitis.  Patient likely experiencing pedal edema with dermatitis secondary to the swelling.  Lab work is reassuring.  No evidence of congestive heart failure.  Normal albumin.  Urinalysis without proteinuria.  Etiology of the swelling is unclear, will initiate diuresis, can continue the clindamycin.  I do not feel like this is a failure of treatment, likely not cellulitis.  Follow-up with primary care doctor.        Final Clinical Impression(s) / ED Diagnoses Final diagnoses:  Leg edema  Dermatitis    Rx / DC Orders ED Discharge Orders          Ordered    furosemide (LASIX) 20 MG tablet  Daily        12/02/21 0707              Gilda Crease, MD 12/02/21 [phone]

## 2021-12-02 NOTE — ED Triage Notes (Signed)
Pt came in with c/o bilateral foot swelling. Diagnosed with cellulitis at [facility] for R foot a week ago. She has been taking Clindamycin. This is her second dose in one month. It has been alternating severity between L and R foot. Pt has hx of cellulitis. She states that today her L foot is more swollen. Pt denies fever at home.

## 2022-07-23 DIAGNOSIS — F1120 Opioid dependence, uncomplicated: Secondary | ICD-10-CM | POA: Diagnosis not present

## 2022-07-23 DIAGNOSIS — R69 Illness, unspecified: Secondary | ICD-10-CM | POA: Diagnosis not present

## 2022-08-06 DIAGNOSIS — F1120 Opioid dependence, uncomplicated: Secondary | ICD-10-CM | POA: Diagnosis not present

## 2022-08-06 DIAGNOSIS — R69 Illness, unspecified: Secondary | ICD-10-CM | POA: Diagnosis not present

## 2022-08-19 DIAGNOSIS — F1123 Opioid dependence with withdrawal: Secondary | ICD-10-CM | POA: Diagnosis not present

## 2022-08-19 DIAGNOSIS — F1120 Opioid dependence, uncomplicated: Secondary | ICD-10-CM | POA: Diagnosis not present

## 2022-08-19 DIAGNOSIS — G4700 Insomnia, unspecified: Secondary | ICD-10-CM | POA: Diagnosis not present

## 2022-08-19 DIAGNOSIS — Z659 Problem related to unspecified psychosocial circumstances: Secondary | ICD-10-CM | POA: Diagnosis not present

## 2022-08-27 DIAGNOSIS — F1120 Opioid dependence, uncomplicated: Secondary | ICD-10-CM | POA: Diagnosis not present

## 2022-08-29 DIAGNOSIS — F1120 Opioid dependence, uncomplicated: Secondary | ICD-10-CM | POA: Diagnosis not present

## 2022-09-04 DIAGNOSIS — F1123 Opioid dependence with withdrawal: Secondary | ICD-10-CM | POA: Diagnosis not present

## 2022-09-04 DIAGNOSIS — F1120 Opioid dependence, uncomplicated: Secondary | ICD-10-CM | POA: Diagnosis not present

## 2024-01-25 IMAGING — DX DG CHEST 2V
2 series · 2 of 2 positions shown · non-contrast
Comparison: Chest CT 02/19/2012

CLINICAL DATA: Edema.  Bilateral foot swelling

EXAM:
CHEST - 2 VIEW

[chest lat]
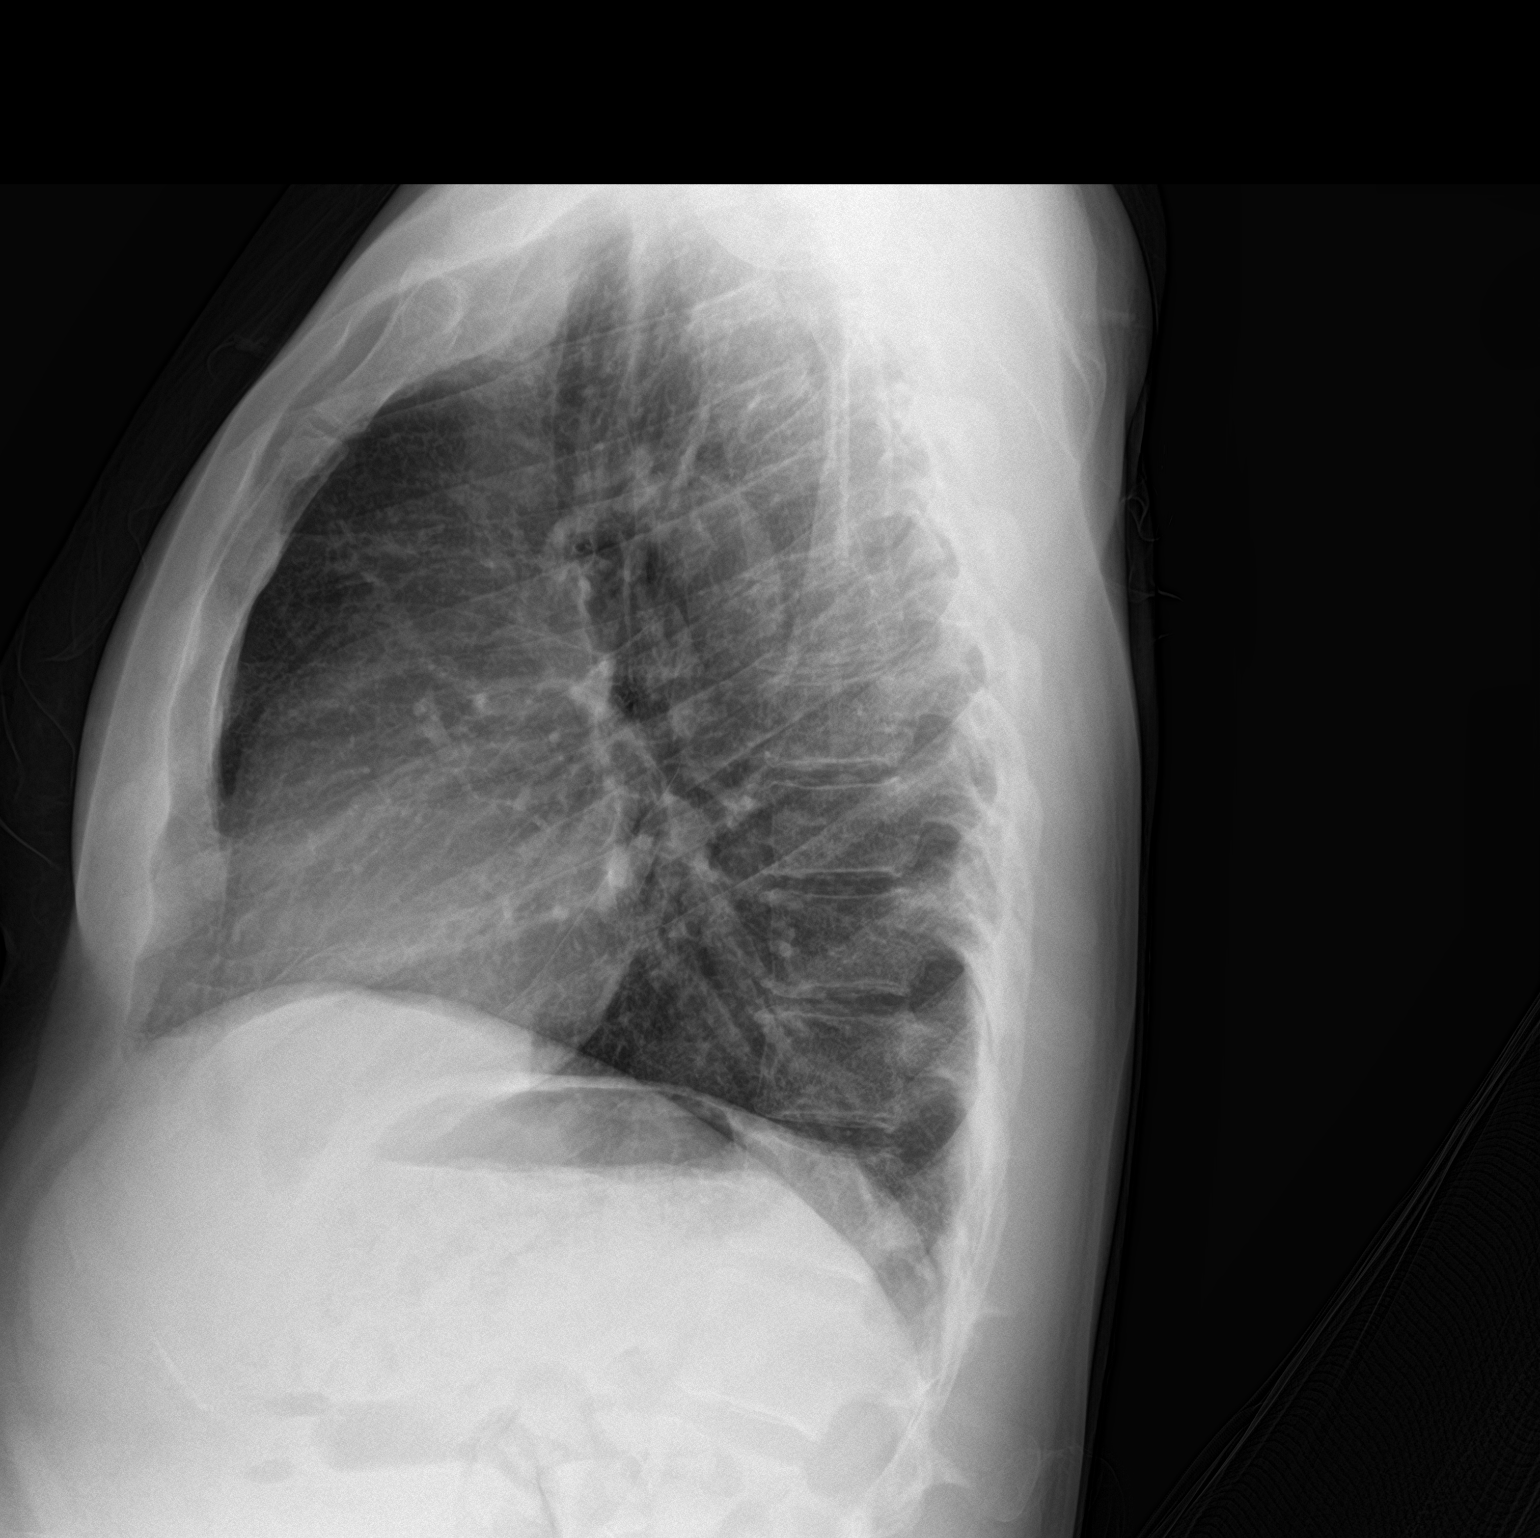

[chest ap]
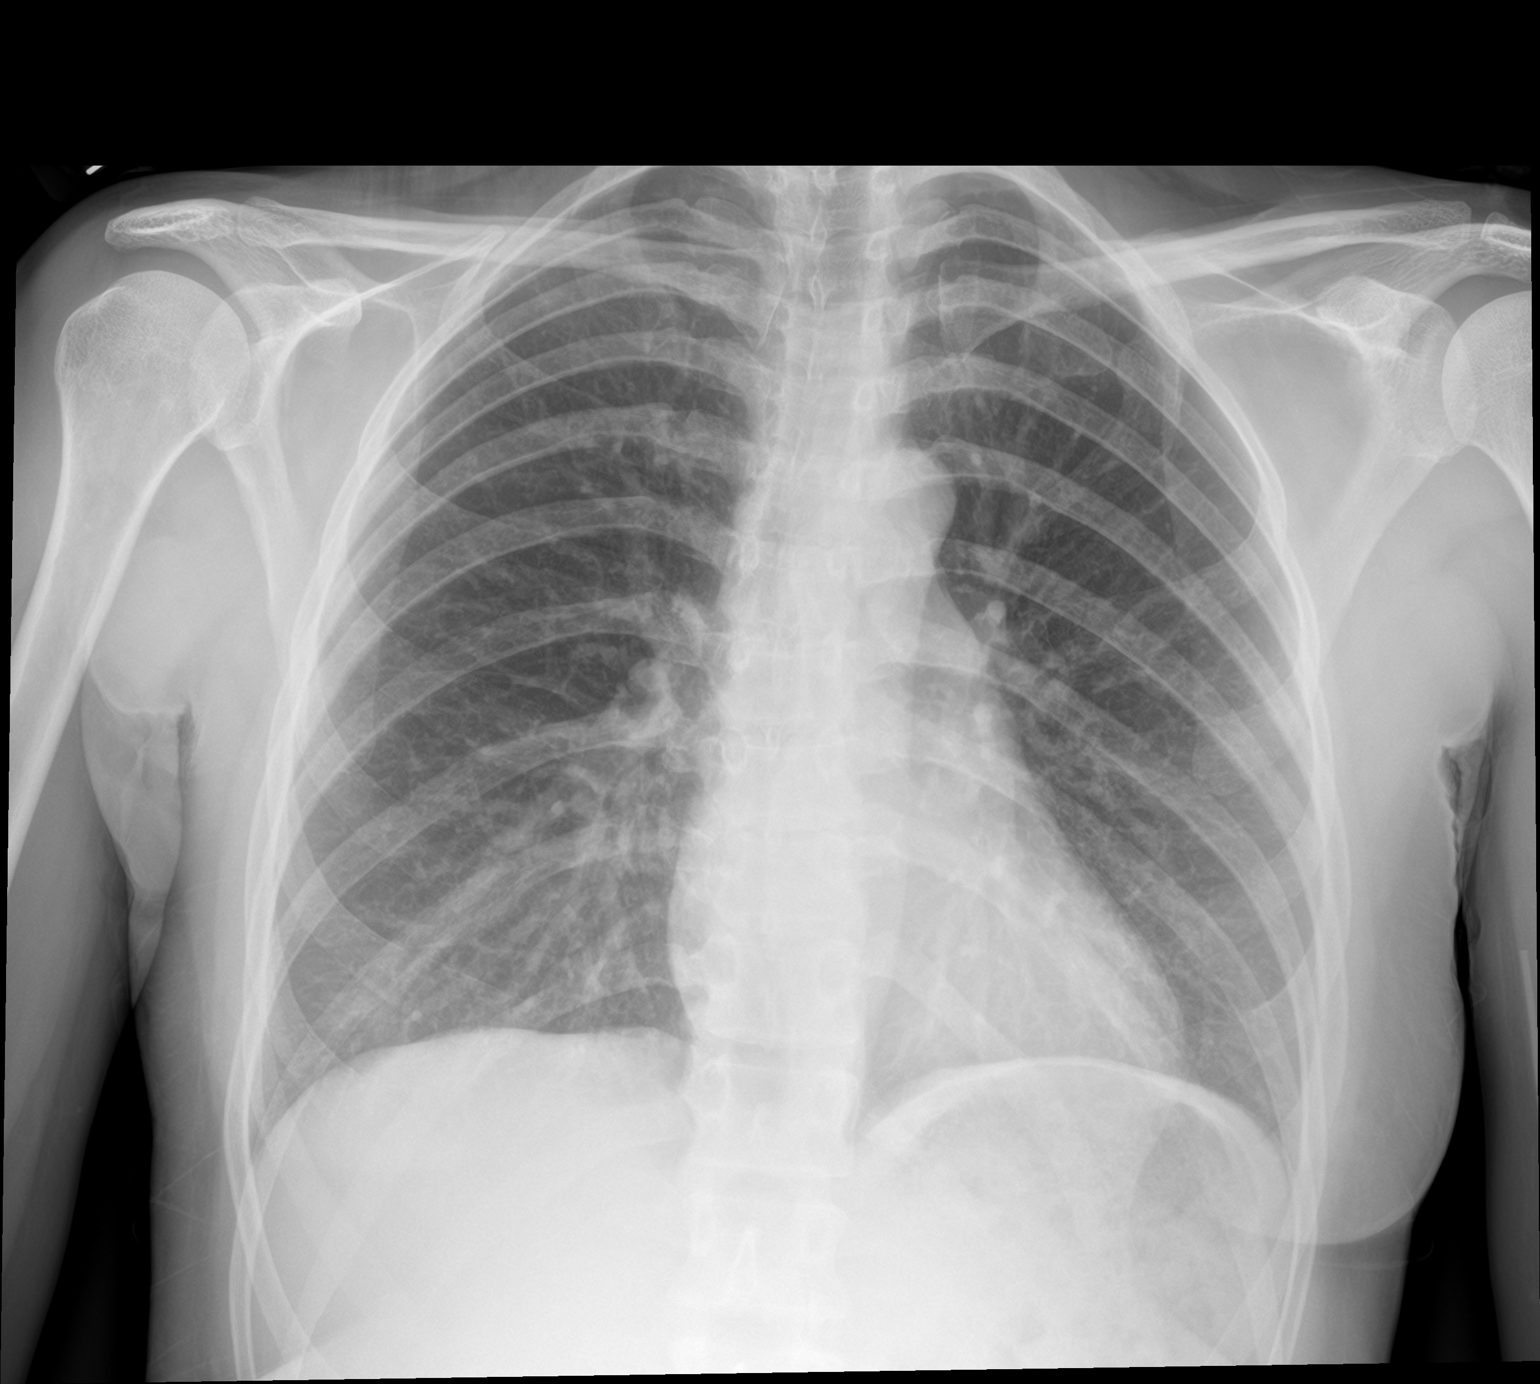

[2 of 2 positions shown; findings below may reference images not displayed]

FINDINGS: Normal heart size and mediastinal contours. No acute infiltrate or
edema. No effusion or pneumothorax. No acute osseous findings.
IMPRESSION: Negative chest.
